# Patient Record
Sex: Female | Born: 1935 | Race: White | Hispanic: No
Health system: Southern US, Community
[De-identification: ages and names within clinical notes are randomized; demographics above are authoritative.]

---

## 1998-04-20 ENCOUNTER — Inpatient Hospital Stay (HOSPITAL_COMMUNITY): Admission: RE | Admit: 1998-04-20 | Discharge: 1998-04-21 | Payer: Self-pay | Admitting: Neurosurgery

## 1998-04-20 ENCOUNTER — Encounter: Payer: Self-pay | Admitting: Neurosurgery

## 2005-09-19 ENCOUNTER — Encounter: Payer: Self-pay | Admitting: Neurosurgery

## 2013-10-27 ENCOUNTER — Inpatient Hospital Stay: Payer: Self-pay | Admitting: Internal Medicine

## 2013-10-27 LAB — COMPREHENSIVE METABOLIC PANEL
ALBUMIN: 2.7 g/dL — AB (ref 3.4–5.0)
Alkaline Phosphatase: 87 U/L
Anion Gap: 9 (ref 7–16)
BUN: 17 mg/dL (ref 7–18)
Bilirubin,Total: 0.7 mg/dL (ref 0.2–1.0)
CHLORIDE: 107 mmol/L (ref 98–107)
Calcium, Total: 8.9 mg/dL (ref 8.5–10.1)
Co2: 26 mmol/L (ref 21–32)
Creatinine: 0.91 mg/dL (ref 0.60–1.30)
EGFR (African American): >60
EGFR (Non-African Amer.): >60
Glucose: 194 mg/dL — ABNORMAL HIGH (ref 65–99)
Osmolality: 290 (ref 275–301)
Potassium: 2.6 mmol/L — ABNORMAL LOW (ref 3.5–5.1)
SGOT(AST): 25 U/L (ref 15–37)
SGPT (ALT): 16 U/L (ref 12–78)
Sodium: 142 mmol/L (ref 136–145)
TOTAL PROTEIN: 5.4 g/dL — AB (ref 6.4–8.2)

## 2013-10-27 LAB — URINALYSIS, COMPLETE
BACTERIA: NONE SEEN
BLOOD: NEGATIVE
Bilirubin,UR: NEGATIVE
Glucose,UR: NEGATIVE mg/dL (ref 0–75)
Hyaline Cast: 2
Ketone: NEGATIVE
Leukocyte Esterase: NEGATIVE
NITRITE: NEGATIVE
PH: 7 (ref 4.5–8.0)
Protein: NEGATIVE
RBC, UR: NONE SEEN /HPF (ref 0–5)
Specific Gravity: 1.014 (ref 1.003–1.030)
Squamous Epithelial: NONE SEEN
WBC UR: 7 /HPF (ref 0–5)

## 2013-10-27 LAB — CBC WITH DIFFERENTIAL/PLATELET
Basophil #: 0 10*3/uL (ref 0.0–0.1)
Basophil %: 0.5 %
Eosinophil #: 0.1 10*3/uL (ref 0.0–0.7)
Eosinophil %: 0.8 %
HCT: 31.9 % — ABNORMAL LOW (ref 35.0–47.0)
HGB: 10.4 g/dL — ABNORMAL LOW (ref 12.0–16.0)
Lymphocyte #: 1.2 10*3/uL (ref 1.0–3.6)
Lymphocyte %: 14.2 %
MCH: 27.7 pg (ref 26.0–34.0)
MCHC: 32.7 g/dL (ref 32.0–36.0)
MCV: 85 fL (ref 80–100)
Monocyte #: 0.5 x10 3/mm (ref 0.2–0.9)
Monocyte %: 5.7 %
NEUTROS ABS: 6.6 10*3/uL — AB (ref 1.4–6.5)
NEUTROS PCT: 78.8 %
Platelet: 237 10*3/uL (ref 150–440)
RBC: 3.77 10*6/uL — AB (ref 3.80–5.20)
RDW: 13.7 % (ref 11.5–14.5)
WBC: 8.3 10*3/uL (ref 3.6–11.0)

## 2013-10-27 LAB — POTASSIUM: Potassium: 3.9 mmol/L (ref 3.5–5.1)

## 2013-10-27 LAB — MAGNESIUM
Magnesium: 0.9 mg/dL — ABNORMAL LOW
Magnesium: 2.3 mg/dL

## 2013-10-27 LAB — PROTIME-INR
INR: 1.2
PROTHROMBIN TIME: 15.1 s — AB (ref 11.5–14.7)

## 2013-10-27 LAB — TROPONIN I: Troponin-I: 0.03 ng/mL

## 2013-10-28 LAB — CBC WITH DIFFERENTIAL/PLATELET
Basophil #: 0 10*3/uL (ref 0.0–0.1)
Basophil #: 0.1 10*3/uL (ref 0.0–0.1)
Basophil %: 0.7 %
Basophil %: 1.1 %
EOS PCT: 1.6 %
Eosinophil #: 0.1 10*3/uL (ref 0.0–0.7)
Eosinophil #: 0.1 10*3/uL (ref 0.0–0.7)
Eosinophil %: 1.6 %
HCT: 28.4 % — ABNORMAL LOW (ref 35.0–47.0)
HCT: 30.2 % — ABNORMAL LOW (ref 35.0–47.0)
HGB: 9.6 g/dL — ABNORMAL LOW (ref 12.0–16.0)
HGB: 9.9 g/dL — ABNORMAL LOW (ref 12.0–16.0)
LYMPHS ABS: 1.9 10*3/uL (ref 1.0–3.6)
Lymphocyte #: 2.2 10*3/uL (ref 1.0–3.6)
Lymphocyte %: 34.1 %
Lymphocyte %: 29.8 %
MCH: 28.6 pg (ref 26.0–34.0)
MCH: 28 pg (ref 26.0–34.0)
MCHC: 33.7 g/dL (ref 32.0–36.0)
MCHC: 32.9 g/dL (ref 32.0–36.0)
MCV: 85 fL (ref 80–100)
MCV: 85 fL (ref 80–100)
Monocyte #: 0.5 x10 3/mm (ref 0.2–0.9)
Monocyte #: 0.5 x10 3/mm (ref 0.2–0.9)
Monocyte %: 7.4 %
Monocyte %: 7.2 %
NEUTROS ABS: 3.9 10*3/uL (ref 1.4–6.5)
Neutrophil #: 3.7 10*3/uL (ref 1.4–6.5)
Neutrophil %: 56.2 %
Neutrophil %: 60.3 %
PLATELETS: 238 10*3/uL (ref 150–440)
Platelet: 230 10*3/uL (ref 150–440)
RBC: 3.34 10*6/uL — ABNORMAL LOW (ref 3.80–5.20)
RBC: 3.54 10*6/uL — AB (ref 3.80–5.20)
RDW: 13.9 % (ref 11.5–14.5)
RDW: 13.6 % (ref 11.5–14.5)
WBC: 6.5 10*3/uL (ref 3.6–11.0)
WBC: 6.4 10*3/uL (ref 3.6–11.0)

## 2013-10-28 LAB — BASIC METABOLIC PANEL
ANION GAP: 7 (ref 7–16)
BUN: 12 mg/dL (ref 7–18)
Calcium, Total: 8.2 mg/dL — ABNORMAL LOW (ref 8.5–10.1)
Chloride: 115 mmol/L — ABNORMAL HIGH (ref 98–107)
Co2: 21 mmol/L (ref 21–32)
Creatinine: 0.83 mg/dL (ref 0.60–1.30)
Glucose: 91 mg/dL (ref 65–99)
Osmolality: 284 (ref 275–301)
Potassium: 3.6 mmol/L (ref 3.5–5.1)
SODIUM: 143 mmol/L (ref 136–145)

## 2013-10-28 LAB — MAGNESIUM: Magnesium: 1.8 mg/dL

## 2013-10-29 LAB — BASIC METABOLIC PANEL
ANION GAP: 5 — AB (ref 7–16)
BUN: 10 mg/dL (ref 7–18)
CREATININE: 0.63 mg/dL (ref 0.60–1.30)
Calcium, Total: 8.4 mg/dL — ABNORMAL LOW (ref 8.5–10.1)
Chloride: 115 mmol/L — ABNORMAL HIGH (ref 98–107)
Co2: 21 mmol/L (ref 21–32)
EGFR (Non-African Amer.): >60
GLUCOSE: 95 mg/dL (ref 65–99)
Osmolality: 280 (ref 275–301)
POTASSIUM: 4.4 mmol/L (ref 3.5–5.1)
Sodium: 141 mmol/L (ref 136–145)

## 2013-10-30 LAB — BASIC METABOLIC PANEL
Anion Gap: 5 — ABNORMAL LOW (ref 7–16)
BUN: 8 mg/dL (ref 7–18)
CALCIUM: 8.8 mg/dL (ref 8.5–10.1)
CO2: 22 mmol/L (ref 21–32)
Chloride: 115 mmol/L — ABNORMAL HIGH (ref 98–107)
Creatinine: 0.93 mg/dL (ref 0.60–1.30)
EGFR (African American): >60
GFR CALC NON AF AMER: 59 — AB
Glucose: 92 mg/dL (ref 65–99)
Osmolality: 281 (ref 275–301)
POTASSIUM: 3.8 mmol/L (ref 3.5–5.1)
Sodium: 142 mmol/L (ref 136–145)

## 2013-10-30 LAB — MAGNESIUM: Magnesium: 1.2 mg/dL — ABNORMAL LOW

## 2013-11-14 ENCOUNTER — Other Ambulatory Visit: Payer: Self-pay | Admitting: Family Medicine

## 2013-11-14 LAB — URINALYSIS, COMPLETE
BILIRUBIN, UR: NEGATIVE
Glucose,UR: NEGATIVE mg/dL (ref 0–75)
Ketone: NEGATIVE
Nitrite: NEGATIVE
Ph: 5 (ref 4.5–8.0)
Protein: 30
Specific Gravity: 1.016 (ref 1.003–1.030)
Squamous Epithelial: NONE SEEN

## 2013-11-17 LAB — URINE CULTURE

## 2013-12-19 DEATH — deceased

## 2014-09-11 NOTE — Consult Note (Signed)
Referring Physician:  Gladstone Lighter :   Primary Care Physician:  [facility] [place], [facility], [address] [place], [place] [address], [phone]  Reason for Consult: Admit Date: 27-Oct-2013  Chief Complaint: seizure  Reason for Consult: seizure   History of Present Illness: History of Present Illness:   81 yo RHD F presents to [facility] secondary to having a witnessed seizure while at her nursing facility.  Description of the event is poor and there are no witnesses present.  EMS gave pt 80m of Versed and there has been no more seizure activity but pt has been quite drowsy.  Pt is just now starting to respond to pain.  Pt is a DNR per chart.  ROS:  Review of Systems   unobtainable secondary to mental status  Past Medical/ Surgical Hx:  Past Medical History dementia, R MCA infarct, HTN, DM, depression, GERD   Past Surgical History none   Home Medications: Medication Instructions Last Modified Date/Time  nicotine 21 mg/24 hr transdermal film, extended release 1 patch transdermal once a day 09-Jun-15 08:32  omeprazole 20 mg oral delayed release capsule 1 cap(s) orally once a day 09-Jun-15 08:32  PARoxetine 40 mg oral tablet 1 tab(s) orally once a day at 9am 09-Jun-15 08:32  Lantus Solostar Pen 100 units/mL subcutaneous solution 10 unit(s) subcutaneous once a day (at bedtime) 09-Jun-15 08:32  acetaminophen 500 mg oral tablet 1 tab(s) orally every 6 hours, As Needed - for Pain 09-Jun-15 08:32  bisacodyl 10 mg rectal suppository 1 suppository(ies) rectal once a day, As Needed - for Constipation 09-Jun-15 08:32  sucralfate 1 g/10 mL oral suspension 10 milliliter(s) orally 4 times a day (before meals and at bedtime) 09-Jun-15 08:32  cyanocobalamin 1000 mcg oral tablet 1 tab(s) orally once a day 9am 09-Jun-15 08:32  dexlansoprazole 60 mg oral delayed release capsule 1 cap(s) orally once a day 9am 09-Jun-15 08:32   hydrochlorothiazide-valsartan 25 mg-320 mg oral tablet 1 tab(s) orally once a day 9am 09-Jun-15 08:32  glipiZIDE 10 mg oral tablet 1 tab(s) orally 2 times a day at 7:30,17:00 09-Jun-15 08:32  albuterol-ipratropium 2.5 mg-0.5 mg/3 mL inhalation solution 3 milliliter(s) inhaled every 6 hours, As Needed - for Wheezing 09-Jun-15 08:32  lovastatin 40 mg oral tablet 1 tab(s) orally once a day (at bedtime 21:00) 09-Jun-15 08:32  melatonin 5 mg oral tablet 1 tab(s) orally once a day (at bedtime)21:00 09-Jun-15 08:32  metFORMIN 500 mg oral tablet 1 tab(s) orally 2 times a day 0800,1700 09-Jun-15 08:32   Allergies:  Sulfamethoxazole: Other  Allergies:  Allergies SMX   Social/Family History: Employment Status: disabled  Lives With: alone  Living Arrangements: assisted living  Social History: remote tob, no EtOH, no illicits  Family History: no strokes, no seizures   Physical Exam: General: poorly kept, mild distress  HEENT: normocephalic, sclera nonicteric, oropharynx clear  Neck: supple, no JVD, no bruits  Chest: CTA B, no wheezing, decent movement  Cardiac: RRR, no murmurs, no edema, 2+ pulses  Extremities: no C/C/E, FROM   Neurologic Exam: Mental Status: lethargic and not oriented, does not follow, will open and localize to pain, moans to pain, GCS 9  Cranial Nerves: pupils 361mB and reactive, CN VI palsy right eye, EOMI otherwise intact, slight L facial droop  Motor Exam: localizes more with R compared to L, increased tone on L  Deep Tendon Reflexes: more brisk on the L, L babinski, mute R  Sensory Exam: localizes to  pain B  Coordination: untestable   Lab Results: Hepatic:  09-Jun-15 06:20   Bilirubin, Total 0.7  Alkaline Phosphatase 87 (45-117 NOTE: New Reference Range 04/10/13)  SGPT (ALT) 16  SGOT (AST) 25  Total Protein, Serum  5.4  Albumin, Serum  2.7  Routine Chem:  09-Jun-15 06:20   Magnesium, Serum  0.9 (1.8-2.4 THERAPEUTIC RANGE: 4-7 mg/dL TOXIC: > 10 mg/dL   -----------------------)  Glucose, Serum  194  BUN 17  Creatinine (comp) 0.91  Sodium, Serum 142  Potassium, Serum  2.6  Chloride, Serum 107  CO2, Serum 26  Calcium (Total), Serum 8.9  Osmolality (calc) 290  eGFR (African American) >60  eGFR (Non-African American) >60 (eGFR values <69m/min/1.73 m2 may be an indication of chronic kidney disease (CKD). Calculated eGFR is useful in patients with stable renal function. The eGFR calculation will not be reliable in acutely ill patients when serum creatinine is changing rapidly. It is not useful in  patients on dialysis. The eGFR calculation may not be applicable to patients at the low and high extremes of body sizes, pregnant women, and vegetarians.)  Anion Gap 9  Cardiac:  09-Jun-15 06:20   Troponin I 0.03 (0.00-0.05 0.05 ng/mL or less: NEGATIVE  Repeat testing in 3-6 hrs  if clinically indicated. >0.05 ng/mL: POTENTIAL  MYOCARDIAL INJURY. Repeat  testing in 3-6 hrs if  clinically indicated. NOTE: An increase or decrease  of 30% or more on serial  testing suggests a  clinically important change)  Routine UA:  09-Jun-15 06:30   Color (UA) Yellow  Clarity (UA) Clear  Glucose (UA) Negative  Bilirubin (UA) Negative  Ketones (UA) Negative  Specific Gravity (UA) 1.014  Blood (UA) Negative  pH (UA) 7.0  Protein (UA) Negative  Nitrite (UA) Negative  Leukocyte Esterase (UA) Negative (Result(s) reported on 27 Oct 2013 at 07:21AM.)  RBC (UA) NONE SEEN  WBC (UA) 7 /HPF  Bacteria (UA) NONE SEEN  Epithelial Cells (UA) NONE SEEN  Mucous (UA) PRESENT  Hyaline Cast (UA) 2 /LPF (Result(s) reported on 27 Oct 2013 at 07:21AM.)  Routine Coag:  09-Jun-15 06:20   Prothrombin  15.1  INR 1.2 (INR reference interval applies to patients on anticoagulant therapy. A single INR therapeutic range for coumarins is not optimal for all indications; however, the suggested range for most indications is 2.0 - 3.0. Exceptions to the INR Reference  Range may include: Prosthetic heart valves, acute myocardial infarction, prevention of myocardial infarction, and combinations of aspirin and anticoagulant. The need for a higher or lower target INR must be assessed individually. Reference: The Pharmacology and Management of the Vitamin K  antagonists: the seventh ACCP Conference on Antithrombotic and Thrombolytic Therapy. CHYIFO.2774Sept:126 (3suppl): [id number] A HCT value >55% may artifactually increase the PT.  In one study,  the increase was an average of 25%. Reference:  "Effect on Routine and Special Coagulation Testing Values of Citrate Anticoagulant Adjustment in Patients with High HCT Values." American Journal of Clinical Pathology 2006;126:400-405.)  Routine Hem:  09-Jun-15 06:20   WBC (CBC) 8.3  RBC (CBC)  3.77  Hemoglobin (CBC)  10.4  Hematocrit (CBC)  31.9  Platelet Count (CBC) 237  MCV 85  MCH 27.7  MCHC 32.7  RDW 13.7  Neutrophil % 78.8  Lymphocyte % 14.2  Monocyte % 5.7  Eosinophil % 0.8  Basophil % 0.5  Neutrophil #  6.6  Lymphocyte # 1.2  Monocyte # 0.5  Eosinophil # 0.1  Basophil # 0.0 (Result(s) reported on 27 Oct 2013 at 07:17AM.)   Radiology Results: CT:    09-Jun-15 06:47, CT Head Without Contrast  CT Head Without Contrast   REASON FOR EXAM:    seizures  COMMENTS:       PROCEDURE: CT  - CT HEAD WITHOUT CONTRAST  - Oct 27 2013  6:47AM     CLINICAL DATA:  Seizure activity.  History of stroke.    EXAM:  CT HEAD WITHOUT CONTRAST    TECHNIQUE:  Contiguous axial images were obtained from the base of the skull  through the vertex without intravenous contrast.    COMPARISON:  None.  FINDINGS:  Diffuse cerebral atrophy. Ventricular dilatation consistent with  central atrophy. There is low-attenuation encephalomalacia or edema  demonstrated in the right posterior frontal, parietal, and temporal  region, extending from the deep white matter and thalamus out to the  cortical surface. This probably  represents an infarct in the  distribution of the middle cerebral artery and would indicate a late  acute or subacute phase. There is still some edema with effacement  of the sulci. Underlying mass lesion with surrounding edema is not  entirely excluded. Consider MRI for additional evaluation if  clinically indicated. No midline shift. No acute intracranial  hemorrhage. Basal cisterns are not effaced. Vascular calcifications.  Mucosal thickening in the paranasal sinuses. Calvarium appears  intact.   IMPRESSION:  Large area of low attenuation change in the right posterior frontal,  parietal, and temporal region likely representing infarct.  Underlying mass lesion is not excluded and MRI may be useful in  further evaluation clinically indicated. No acute intracranial  hemorrhage.      Electronically Signed    ByTonye Royalty M.D.    On: 10/27/2013 06:50         Verified By: Neale Burly, M.D.,   Radiology Impression: Radiology Impression: CT of head personally reviewed by me and shows large old R MCA infarct   Impression/Recommendations: Recommendations:   prior notes reviewed by me reviewed by me   Possible status epilepticus-  pt has not returned back to baseline after having a seizure so we must r/o ongoing seizures which will put pt at risk for further brain damage or even death.  This could also be encephalopathy or prolonged post-ictal period after seizure. Old R MCA infarct-  stable but symptomatic EEG today to r/o continued seizures no need for MRI ok to continue Keppra 516m BID but preferred dilantin as stressed before must keep Mg > 2, Ca > 8 and Na > 130 to prevent further seizures continue ASA and statin will follow closely minutes of critical care time spent, examining pt, reviewing chart and coordinating care  Electronic Signatures: SJamison Neighbor(MD)  (Signed 09-Jun-15 10:16)  Authored: REFERRING PHYSICIAN, Primary Care Physician, Consult, History of  Present Illness, Review of Systems, PAST MEDICAL/SURGICAL HISTORY, HOME MEDICATIONS, ALLERGIES, Social/Family History, Physical Exam-, LAB RESULTS, RADIOLOGY RESULTS, Recommendations   Last Updated: 09-Jun-15 10:16 by SJamison Neighbor(MD)

## 2014-09-11 NOTE — H&P (Signed)
PATIENT NAME:  Kelly Dudley, Kelly Dudley MR#:  [id number] DATE OF BIRTH:  [birth year]  DATE OF ADMISSION:  10/27/2013  ADMITTING PHYSICIAN: Enid Baasadhika Mally Gavina, MD  PRIMARY CARE PHYSICIAN: Teena Iraniavid M. Terance HartBronstein, MD  CHIEF COMPLAINT: Seizure.  HISTORY OF PRESENT ILLNESS: Ms. Kelly Dudley is a 81 year old Caucasian female with past medical history significant for hypertension, diabetes, gastroesophageal reflux disease and peptic ulcer disease, Alzheimer'Dudley dementia and recent history of right MCA infarct, resulting in left hemiplegia in May 2015, and since then, [facility], and recently transferred over to [facility]. The patient does not have any history of prior strokes or seizures. Since her last stroke, she is hemiplegic on her left side, working with physical therapy, requiring assistance even to stand up. Mental status has been alert, slurred speech, with periods of confusion, according to the son. Son is at bedside and able to provide most of the history. She has not had any prior seizures, but he received a call early this morning stating that the patient had a witnessed, about 25 to 30 minute seizure activity with rolling of her eyes and jerking of her extremities, for which EMS was called, and the patient was transferred over here. The patient is postictal, very confused. Following some commands. Unable to even open her eyes at this time. Labs show that her potassium and magnesium are extremely low. Her appetite has been poor, and her intake has also been decreased since her stroke, according to son.    PAST MEDICAL HISTORY:  1. Hypertension.  2. Insulin-dependent diabetes mellitus.  3. History of right MCA stroke with left hemiplegia.  4. Alzheimer'Dudley dementia.   PAST SURGICAL HISTORY: Back surgery and metal rod in the back.   ALLERGIES TO MEDICATIONS: SULFAMETHOXAZOLE.   CURRENT MEDICATIONS: The one on file indicates that the patient is on the  following: 1. Nicotine patch transdermal 21 mg daily.  2. NovoLog insulin 3 units 3 times a day before meals.  3. Omeprazole 20 mg p.o. daily.  4. Paxil 40 mg p.o. daily.  5. Lantus 10 units subcutaneous at bedtime.  6. Glucagon p.r.n. for hypoglycemia.  7. Carafate 10 mL 4 times a day.  8. Vitamin B12 1000 mcg p.o. daily.  9. Diovan/hydrochlorothiazide 320/25 mg p.o. daily.  10. Glipizide 10 mg p.o. b.i.d.  11. Albuterol/ipratropium nebulizer q.6 hours p.r.n. for wheezing.  12. Lovastatin 40 mg p.o. daily.  13. Melatonin 5 mg p.o. at bedtime.  14. Metformin 500 mg p.o. b.i.d.   SOCIAL HISTORY: The patient has been a resident of [facility] for almost 10 days now. Before the stroke, she was ambulatory, living by [place], though she had some dementia and was not driving around. Since the stroke, the patient has left-sided weakness, not able to walk and in the process of working with physical therapy. Continues to smoke. She used to smoke about 1 pack per day before the stroke. No alcohol or other drug use.   FAMILY HISTORY: Significant for cancers in the family. The patient'Dudley son died from colon cancer. Her daughter committed suicide. The patient'Dudley mom had breast cancer and sister had another type of cancer.   REVIEW OF SYSTEMS: Difficult to be obtained as the patient is lethargic and postictal.   PHYSICAL EXAMINATION:  VITAL SIGNS: Temperature 98 degrees Fahrenheit, pulse 92, respirations 20, blood pressure 120/66, pulse oximetry 95% on room air.  GENERAL: Well-built, well-nourished female lying in bed, not in any acute distress.  HEENT: Normocephalic, atraumatic. Pupils are equal, round and reacting to light. Anicteric sclerae. Right eye is medially deviated, which according to son, is chronic, but more pronounced now. Left eye extraocular movements seem to be intact. Oropharynx is clear, without any erythema, mass or exudate. Very dry mucous membranes.  NECK: Supple.  No thyromegaly, JVD or carotid bruits. No lymphadenopathy.  LUNGS: Moving air bilaterally. Decreased bibasilar breath sounds. No wheeze or crackles.  CARDIOVASCULAR: S1 and S2, regular rate and rhythm. No murmurs, rubs or gallops.  ABDOMEN: Soft, nontender, nondistended. No hepatosplenomegaly. Normal bowel sounds.  EXTREMITIES: No pedal edema. No clubbing or cyanosis, 2+ dorsalis pedis pulses palpable bilaterally.  SKIN: No acne, rash or lesions.  LYMPHATICS: No cervical lymphadenopathy.  NEUROLOGICAL: Cranial nerves seem to be intact with no fascial droop; however, unable to do a complete neuro exam secondary to the patient'Dudley lethargy. She is squeezing hands with her right upper extremity and able to move the right lower extremity on command. She has some movement of left lower extremity in the bed, but not moving her left upper extremity at this time.  PSYCHOLOGICAL: The patient is lethargic and postictal, not alert or oriented.   LABORATORY DATA:  WBC 8.3, hemoglobin 10.4, hematocrit 31.9, platelet count 237.  Sodium 142, potassium 2.6, chloride 107, bicarbonate 26, BUN 17, creatinine 0.91, glucose 194, calcium of 8.9.  ALT 16, AST 25, alkaline phosphatase 87, total bilirubin 0.7, albumin of 2.7.  Urinalysis negative for any infection.  INR is 1.2. Troponin 0.03.  Magnesium 0.9.  CT of the head without contrast showing large area of attenuation change in right posterior frontal, parietal, temporal, likely representing infarct, underlying mass lesion is not excluded. No acute intracranial hemorrhage.  Chest x-ray revealing no edema or consolidation. Heart is mildly enlarged. Old left clavicular fracture is seen. Evidence of calcific tendinosis in the left shoulder also noted.  EKG is done and showing left axis deviation, left bundle branch block; however, normal sinus rhythm, heart rate of 93.   ASSESSMENT AND PLAN: A 81 year old female with history of recent large right middle cerebral artery  stroke, diabetes, hypertension, who comes in from [facility] secondary to witnessed seizures.   1. Seizure, likely focus from large right middle cerebral artery infarct. CT of the head showing that old infarct. Will admit to medical floor. Get an EEG done. Load with Keppra. Neuro checks, neuro consult. IV Keppra b.i.d. after loading, and monitor for now.  2. History of cerebrovascular accident with left hemiplegia, at rehab. Will go back to rehab. For now, rectal aspirin. Will restart the statin when she is able to take p.o. She will also need a swallow evaluation. She was on mechanical soft diet over at the nursing home. The patient has living will stating that she does not want any feeding tubes.  3. Diabetes mellitus. Hold Lantus, glipizide and metformin as she is n.p.o. while in postictal state. On sliding scale insulin for now.  4. Gastroesophageal reflux disease and peptic ulcers. Protonix IV for now.  5. Deep vein thrombosis prophylaxis, on subcutaneous heparin.   CODE STATUS: Do not resuscitate. Discussed with son at bedside, and the patient has DNR form signed.   TIME SPENT ON ADMISSION: 50 minutes.   ____________________________ Enid Baas, MD rk:lb D: 10/27/2013 08:51:18 ET T: 10/27/2013 09:27:38 ET JOB#: [id number]  cc: Enid Baas, MD, <Dictator> Teena Irani. Terance Hart, MD Enid Baas MD ELECTRONICALLY SIGNED 10/31/2013 15:51

## 2014-09-11 NOTE — Discharge Summary (Signed)
PATIENT NAME:  Kelly Dudley, Kelly Dudley MR#:  [id number] DATE OF BIRTH:  12-Mar-1934  DATE OF ADMISSION:  10/27/2013 DATE OF DISCHARGE:  10/30/2013   ADMITTING PHYSICIAN: Enid Baas, MD  DISCHARGING PHYSICIAN: Enid Baas, MD  PRIMARY CARE PHYSICIAN: Teena Irani. Terance Hart, MD  CONSULTATIONS IN THE HOSPITAL: Neurology consultation by Dr. Mellody Drown.   DISCHARGE DIAGNOSES:  1. Seizures.  2. History of right middle cerebral artery cerebrovascular accident with left hemiplegia.  3. Hypokalemia and hypomagnesemia.  4. Gastroesophageal reflux disease.  5. Dementia.  6. Hypertension.  7. Diabetes mellitus.   DISCHARGE HOME MEDICATIONS:  1. Omeprazole 20 mg p.o. daily.  2. Paxil 40 mg p.o. daily.  3. Lantus 10 units subcutaneous at bedtime.  4. Tylenol 500 mg q.6 hours p.r.n. for pain.  5. Bisacodyl suppository 10 mg rectally as needed for constipation.  6. Sucralfate 1 gram/10 mL oral suspension 10 mL 4 times a day.  7. Cyanocobalamin 1000 mcg p.o. daily.  8. Lovastatin 40 mg p.o. at bedtime.  9. Melatonin 5 mg p.o. daily.  10. Metformin 500 mg p.o. b.i.d.  11. DuoNebs 3 mL q.6 hours p.r.n. for wheezing.  12. Nicotine patch 21 mg transdermal daily.  13. Keppra 500 mg p.o. b.i.d.  14. Aspirin 81 mg p.o. daily.  15. Magnesium oxide 400 mg p.o. daily.  16. Diovan 160 mg p.o. daily.   DISCHARGE OXYGEN: None.   DISCHARGE DIET: Low-sodium diet. Discharge diet consistency: Pureed diet with honey-thick liquids.   ACTIVITY: As tolerated.    FOLLOWUP INSTRUCTIONS:  1. Physical therapy.  2. Speech therapy and swallow evaluation followup.  3. PCP followup in 1 week.   LABORATORY AND IMAGING STUDIES PRIOR TO DISCHARGE:  Sodium 142, potassium 3.8, chloride 115, bicarbonate 22, BUN 8, creatinine 0.93, glucose 92 and calcium of 8.8, magnesium 1.2. WBC 6.4, hemoglobin 9.9, hematocrit 30.2, platelet count is 238.  Chest x-ray showing clear lung fields, mildly enlarged heart. No  edema or consolidation noted. CT of the head without contrast on admission showing large area of attenuation change in right posterior frontal, parietal and temporal region, likely infarct. No acute intracranial hemorrhage. UA is negative for any infection.  Troponins negative.   BRIEF HOSPITAL COURSE: Kelly Dudley is a 81 year old Caucasian female with past medical history significant for an acute right MCA stroke in May 2015, which resulted in left hemiparesis, dysphagia, slurred speech, decrease mobility, who has been discharged to [facility] recently. She was brought in secondary to a witnessed seizure episode.   1. Seizures, likely focus from the patient's stroke and encephalomalacia and gliosis in that area. She was postictal for almost 36 hours, woke up. She was given a loading dose of Keppra and is being discharged on Keppra b.i.d. She was seen by neurologist, Dr. Katrinka Blazing, who recommended the same things. The EEG was done which showed abnormal slowing which is intermittent and generalized and can be nonspecific with her dementia or metabolic encephalopathy. Her course has been otherwise uneventful. She is awake and alert at this time, following commands, but is confused because of her underlying dementia. She is on aspirin and statin for her stroke.  2. Hypokalemia and hypomagnesemia. She had severely low electrolytes, which included a magnesium of 0.9 and a potassium of 2.6 on admission, which could also be triggering her seizures. Electrolytes have been replaced, and she will be discharged on daily maintenance dose of magnesium, and her hydrochlorothiazide is being stopped.  3. Hypertension. She is only on Diovan at this  time.  4. Diabetes mellitus. She is on Lantus and metformin. Sugars have been well controlled, so glipizide is being suspended.  5. Dysphagia secondary to her underlying stroke and also her decreased mental status. She was placed on a pureed diet with honey-thick liquids.  Further swallow evaluation can be followed up on as swallow evaluation and speech therapist consult can be continued over at the rehab place.  6. Her course has been otherwise uneventful in the hospital.   DISCHARGE CONDITION: Stable.   DISCHARGE DISPOSITION: To skilled nursing facility.  CODE STATUS: Do not resuscitate.   TIME SPENT ON DISCHARGE: 40 minutes.   ____________________________ Enid Baasadhika Margia Wiesen, MD rk:lb D: 10/30/2013 12:47:32 ET T: 10/30/2013 13:25:05 ET JOB#: [id number]  cc: Enid Baasadhika Concepcion Gillott, MD, <Dictator> Enid BaasADHIKA Chasin Findling MD ELECTRONICALLY SIGNED 10/31/2013 15:50

## 2014-11-19 IMAGING — CT CT HEAD WITHOUT CONTRAST
1 series · 15 of 30 positions shown, 19 images · non-contrast
Comparison: None.

CLINICAL DATA: Seizure activity.  History of stroke.

EXAM:
CT HEAD WITHOUT CONTRAST
TECHNIQUE: Contiguous axial images were obtained from the base of the skull
through the vertex without intravenous contrast.

[Series 2: head wo · axial · 0.39mm/px · z∈[+582,+708]mm · 15 of 32 slices shown, 19 images]
[im 2/32  brain]
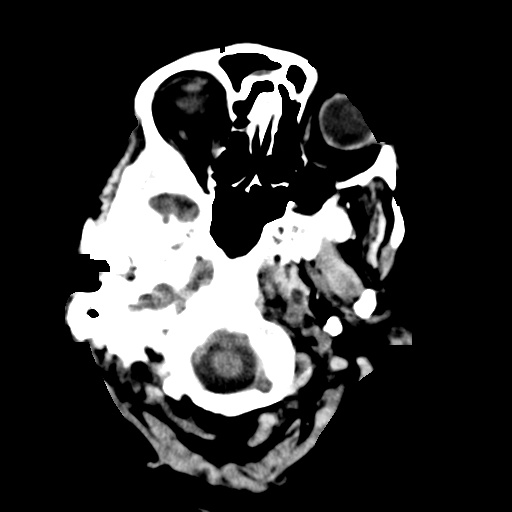
[im 2/32  bone]
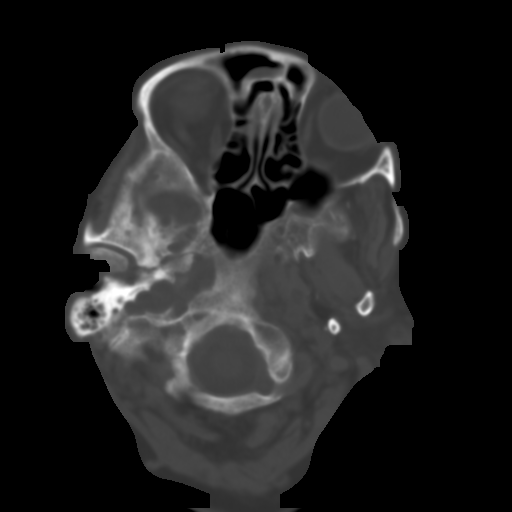
[im 4/32  brain]
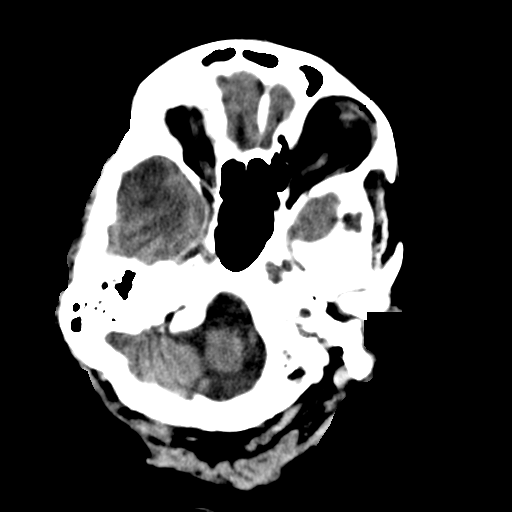
[im 6/32  brain]
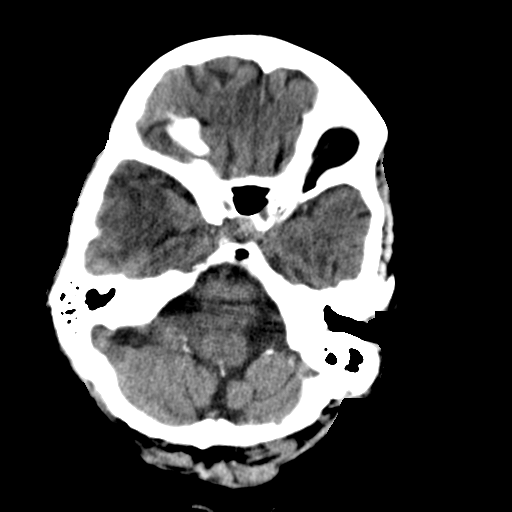
[im 8/32  brain]
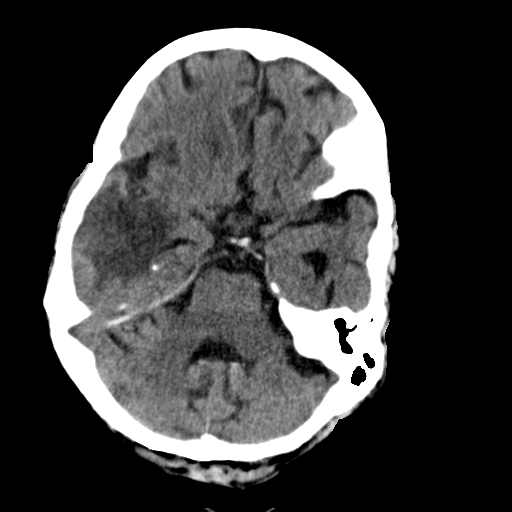
[im 10/32  brain]
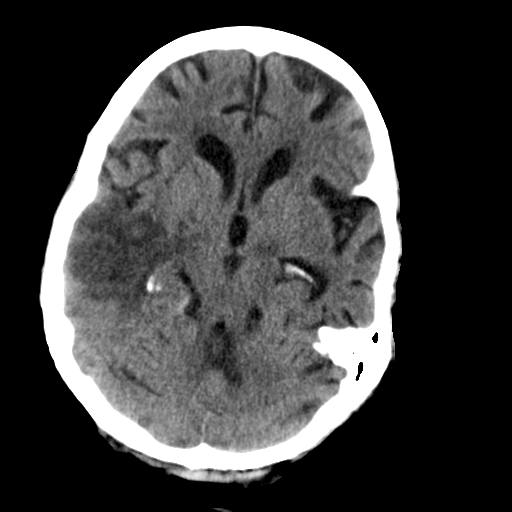
[im 10/32  bone]
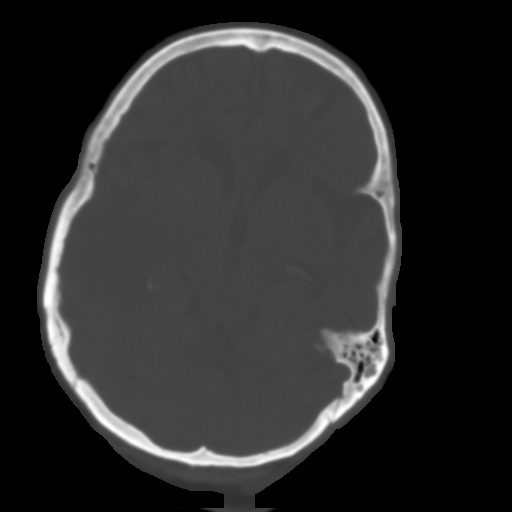
[im 12/32  brain]
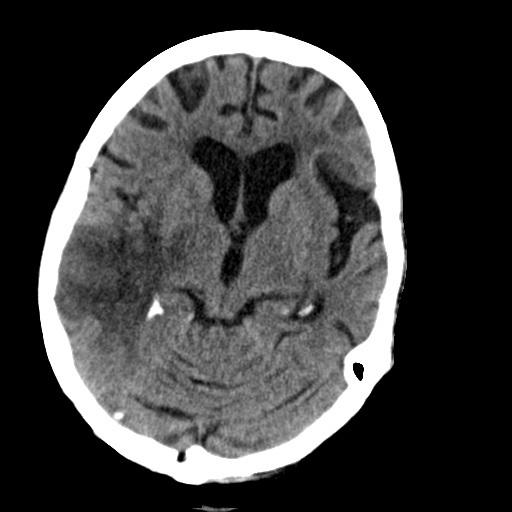
[im 14/32  brain]
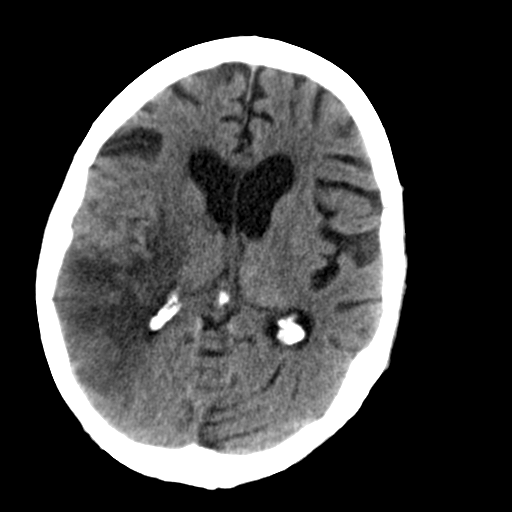
[im 17/32  brain]
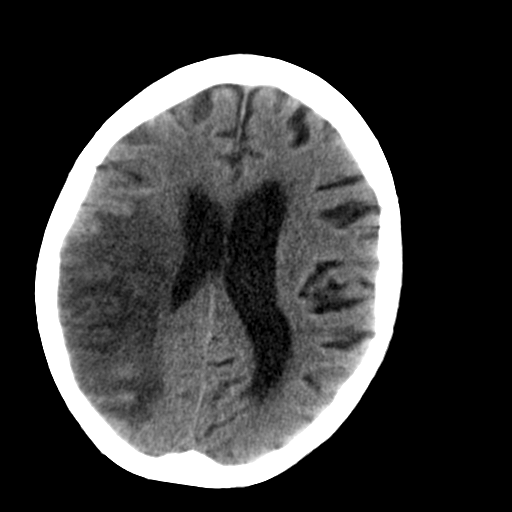
[im 18/32  brain]
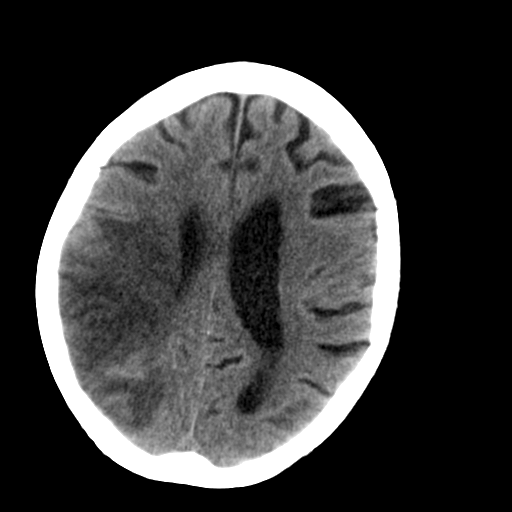
[im 18/32  bone]
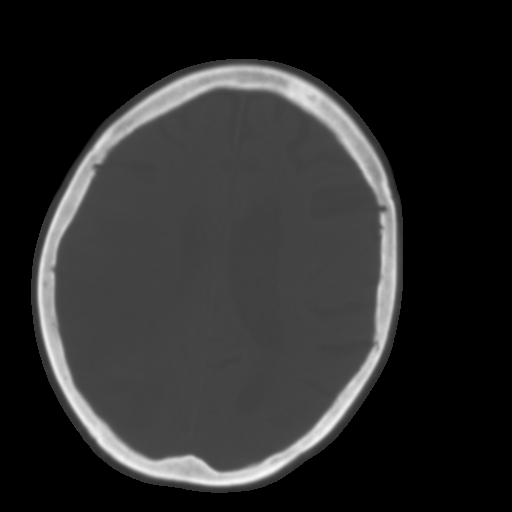
[im 20/32  brain]
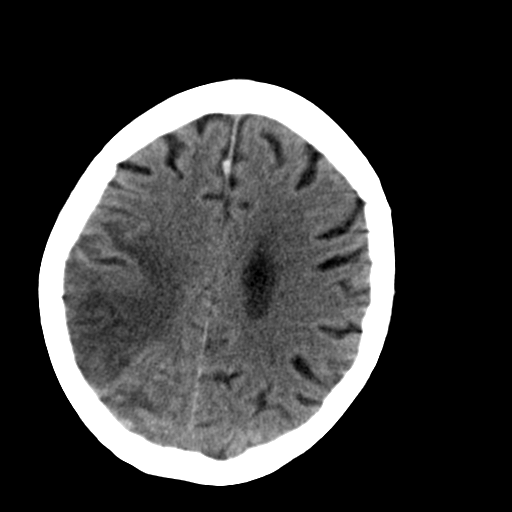
[im 22/32  brain]
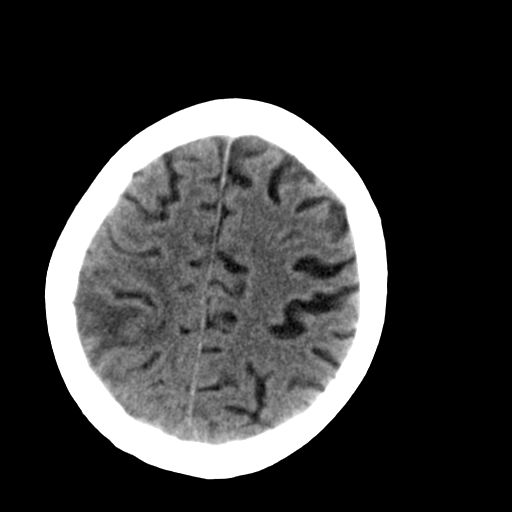
[im 24/32  brain]
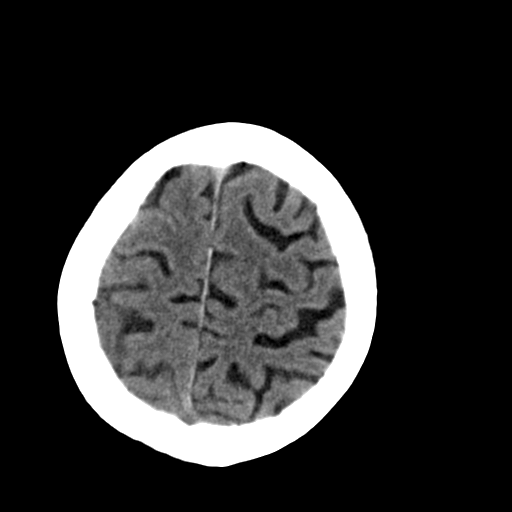
[im 26/32  brain]
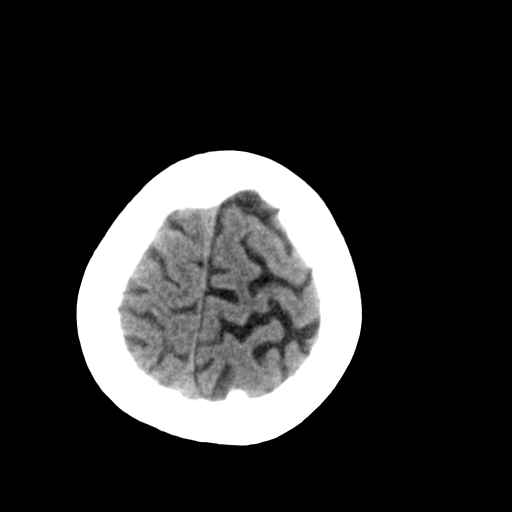
[im 26/32  bone]
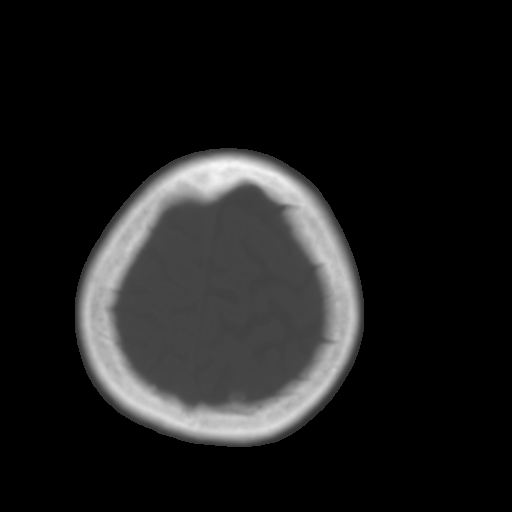
[im 28/32  brain]
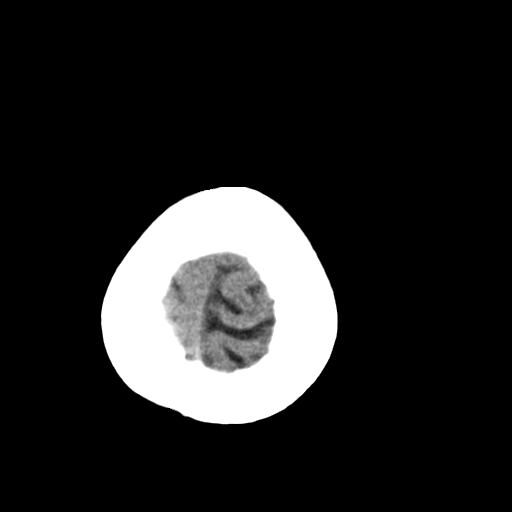
[im 30/32  brain]
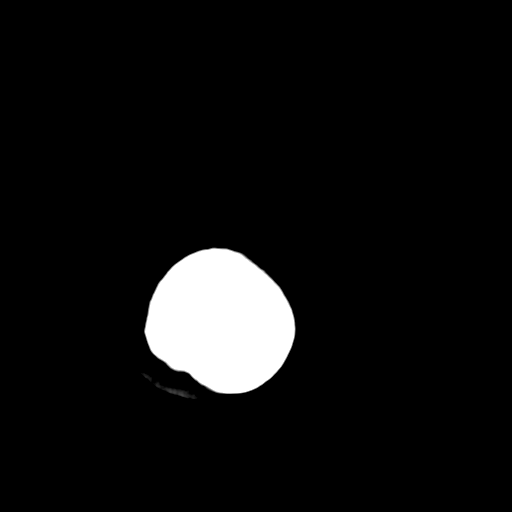

[15 of 30 positions shown; findings below may reference images not displayed]

FINDINGS: Diffuse cerebral atrophy. Ventricular dilatation consistent with
central atrophy. There is low-attenuation encephalomalacia or edema
demonstrated in the right posterior frontal, parietal, and temporal
region, extending from the deep white matter and thalamus out to the
cortical surface. This probably represents an infarct in the
distribution of the middle cerebral artery and would indicate a late
acute or subacute phase. There is still some edema with effacement
of the sulci. Underlying mass lesion with surrounding edema is not
entirely excluded. Consider MRI for additional evaluation if
clinically indicated. No midline shift. No acute intracranial
hemorrhage. Basal cisterns are not effaced. Vascular calcifications.
Mucosal thickening in the paranasal sinuses. Calvarium appears
intact.
IMPRESSION: Large area of low attenuation change in the right posterior frontal,
parietal, and temporal region likely representing infarct.
Underlying mass lesion is not excluded and MRI may be useful in
further evaluation clinically indicated. No acute intracranial
hemorrhage.

## 2017-05-28 ENCOUNTER — Other Ambulatory Visit: Payer: Self-pay
